# Patient Record
Sex: Female | Born: 1995 | Race: White | Hispanic: No | Marital: Single | Smoking: Never smoker
Health system: Southern US, Community
[De-identification: ages and names within clinical notes are randomized; demographics above are authoritative.]

## PROBLEM LIST (undated history)

## (undated) DIAGNOSIS — F32A Depression, unspecified: Secondary | ICD-10-CM

## (undated) DIAGNOSIS — F419 Anxiety disorder, unspecified: Secondary | ICD-10-CM

---
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
## 2016-01-10 ENCOUNTER — Encounter: Payer: Self-pay | Admitting: *Deleted

## 2016-01-10 ENCOUNTER — Emergency Department
Admission: EM | Admit: 2016-01-10 | Discharge: 2016-01-10 | Disposition: A | Discharge status: Home / Self Care | Payer: 59 | Attending: Emergency Medicine | Admitting: Emergency Medicine

## 2016-01-10 DIAGNOSIS — N3001 Acute cystitis with hematuria: Secondary | ICD-10-CM | POA: Insufficient documentation

## 2016-01-10 DIAGNOSIS — R319 Hematuria, unspecified: Secondary | ICD-10-CM | POA: Diagnosis present

## 2016-01-10 LAB — URINALYSIS COMPLETE WITH MICROSCOPIC (ARMC ONLY)
Bacteria, UA: NONE SEEN
SPECIFIC GRAVITY, URINE: 1.015 (ref 1.005–1.030)
SQUAMOUS EPITHELIAL / LPF: NONE SEEN

## 2016-01-10 LAB — POCT PREGNANCY, URINE: Preg Test, Ur: NEGATIVE

## 2016-01-10 MED ORDER — PHENAZOPYRIDINE HCL 200 MG PO TABS
200.0000 mg | ORAL_TABLET | Freq: Once | ORAL | Status: AC
  Administered 2016-01-10: 200 mg via ORAL
  Filled 2016-01-10: qty 1

## 2016-01-10 MED ORDER — TRAMADOL HCL 50 MG PO TABS: 50.0000 mg | ORAL_TABLET | Freq: Three times a day (TID) | ORAL | 0 refills | Status: DC | PRN

## 2016-01-10 MED ORDER — TRAMADOL HCL 50 MG PO TABS
50.0000 mg | ORAL_TABLET | Freq: Once | ORAL | Status: AC
  Administered 2016-01-10: 50 mg via ORAL
  Filled 2016-01-10: qty 1

## 2016-01-10 MED ORDER — ONDANSETRON 4 MG PO TBDP
4.0000 mg | ORAL_TABLET | Freq: Once | ORAL | Status: AC
  Administered 2016-01-10: 4 mg via ORAL
  Filled 2016-01-10: qty 1

## 2016-01-10 MED ORDER — ONDANSETRON 4 MG PO TBDP: 4.0000 mg | ORAL_TABLET | Freq: Four times a day (QID) | ORAL | 0 refills | Status: DC | PRN

## 2016-01-10 MED ORDER — FLAVOXATE HCL 100 MG PO TABS: 100.0000 mg | ORAL_TABLET | Freq: Three times a day (TID) | ORAL | 0 refills | Status: DC | PRN

## 2016-01-10 MED ORDER — SULFAMETHOXAZOLE-TRIMETHOPRIM 800-160 MG PO TABS: 1.0000 | ORAL_TABLET | Freq: Two times a day (BID) | ORAL | 0 refills | Status: DC

## 2016-01-10 MED ORDER — SULFAMETHOXAZOLE-TRIMETHOPRIM 800-160 MG PO TABS
1.0000 | ORAL_TABLET | Freq: Once | ORAL | Status: AC
  Administered 2016-01-10: 1 via ORAL
  Filled 2016-01-10: qty 1

## 2016-01-10 NOTE — Discharge Instructions (Signed)
Take the prescription antibiotic as directed. Take the other medicines as needed. Increase fluid intake, but avoid high-sugar and carbonated drinks. Empty your bladder regularly. Follow-up with your provider for test of cure following treatment. Return to the ED for increased pain, vomiting, or abdominal/flank pain.

## 2016-01-10 NOTE — ED Triage Notes (Addendum)
States difficulty urinating that began this AM, states lower back pain and blood in her urine, states hx of same with no diagnosis, pt awake and alert in no acute distress, states she took AZO this AM

## 2016-01-10 NOTE — ED Provider Notes (Signed)
[facility] Emergency Department Provider Note ____________________________________________  Time seen: 1219  I have reviewed the triage vital signs and the nursing notes.  HISTORY  Chief Complaint  Hematuria  HPI Crystal Griffin is a 20 y.o. female isn't to the ED with reports of dysuria and hematuria over the last 3 days. She describes today the onset of gross hematuria after urinating. Over the last 3 days she's had frequency, urgency, and burning pain with discomfort localized to the lower pelvic region and the right flank.She reports fevers yesterday as well as nausea and vomiting. She's had decreased appetite over the last 2-3 days as well. She has dosed Azo this morning for symptom relief. She denies previous history of UTI or any other symptoms at this time.  History reviewed. No pertinent past medical history.  There are no active problems to display for this patient.  No past surgical history on file.  Prior to Admission medications   Medication Sig Start Date End Date Taking? Authorizing Provider  flavoxATE (URISPAS) 100 MG tablet Take 1 tablet (100 mg total) by mouth 3 (three) times daily as needed for bladder spasms. 01/10/16   Flower Franko V Bacon Khalie Wince, PA-C  ondansetron (ZOFRAN ODT) 4 MG disintegrating tablet Take 1 tablet (4 mg total) by mouth every 6 (six) hours as needed for nausea or vomiting. 01/10/16   Mordecai Tindol V Bacon Suresh Audi, PA-C  sulfamethoxazole-trimethoprim (BACTRIM DS,SEPTRA DS) 800-160 MG tablet Take 1 tablet by mouth 2 (two) times daily. 01/10/16   Varick Keys V Bacon Churchill Grimsley, PA-C  traMADol (ULTRAM) 50 MG tablet Take 1 tablet (50 mg total) by mouth 3 (three) times daily as needed. 01/10/16   Johathan Province V Bacon Abdulaziz Toman, PA-C   Allergies Penicillins  History reviewed. No pertinent family history.  Social History Social History  Substance Use Topics  . Smoking status: Not on file  . Smokeless tobacco: Not on file  . Alcohol use Not on file    Review of Systems  Constitutional: Positive for fever. Eyes: Negative for visual changes. ENT: Negative for sore throat. Cardiovascular: Negative for chest pain. Respiratory: Negative for shortness of breath. Gastrointestinal: Negative for abdominal pain, and diarrhea. Reports right flank pain, nausea and vomiting. Genitourinary: Positive for dysuria, frequency, and hematuria. Musculoskeletal: Negative for back pain. Skin: Negative for rash. Neurological: Negative for headaches, focal weakness or numbness. ____________________________________________  PHYSICAL EXAM:  VITAL SIGNS: ED Triage Vitals [01/10/16 1000]  Enc Vitals Group     BP 126/76     Pulse Rate 97     Resp 18     Temp 97.5 F (36.4 C)     Temp Source Oral     SpO2 98 %     Weight 110 lb (49.9 kg)     Height 5\' 6"  (1.676 m)     Head Circumference      Peak Flow      Pain Score 10     Pain Loc      Pain Edu?      Excl. in GC?    Constitutional: Alert and oriented. Well appearing and in no distress. Head: Normocephalic and atraumatic. Cardiovascular: Normal rate, regular rhythm. Normal distal pulses. Respiratory: Normal respiratory effort. No wheezes/rales/rhonchi. Gastrointestinal: Soft and nontender. No distention Or rebound, guarding, or rigidity. Right flank pain noted. Musculoskeletal: Nontender with normal range of motion in all extremities.  Neurologic: Normal speech and language. No gross focal neurologic deficits are appreciated. Skin:  Skin is warm, dry and intact. No  rash noted. Psychiatric: Mood and affect are normal. Patient exhibits appropriate insight and judgment. ____________________________________________   LABS (pertinent positives/negatives) Labs Reviewed  URINALYSIS COMPLETEWITH MICROSCOPIC ([facility] ONLY) - Abnormal; Notable for the following:       Result Value   Color, Urine ORANGE (*)    APPearance CLOUDY (*)    Glucose, UA   (*)    Value: TEST NOT REPORTED DUE TO COLOR  INTERFERENCE OF URINE PIGMENT   Bilirubin Urine   (*)    Value: TEST NOT REPORTED DUE TO COLOR INTERFERENCE OF URINE PIGMENT   Ketones, ur   (*)    Value: TEST NOT REPORTED DUE TO COLOR INTERFERENCE OF URINE PIGMENT   Hgb urine dipstick   (*)    Value: TEST NOT REPORTED DUE TO COLOR INTERFERENCE OF URINE PIGMENT   Protein, ur   (*)    Value: TEST NOT REPORTED DUE TO COLOR INTERFERENCE OF URINE PIGMENT   Nitrite   (*)    Value: TEST NOT REPORTED DUE TO COLOR INTERFERENCE OF URINE PIGMENT   Leukocytes, UA   (*)    Value: TEST NOT REPORTED DUE TO COLOR INTERFERENCE OF URINE PIGMENT   All other components within normal limits  URINE CULTURE  POC URINE PREG, ED  POCT PREGNANCY, URINE  ____________________________________________  PROCEDURES  Zofran 4 mg ODT Bactrim DS 1 PO Pyridium 200 mg PO Ultram 50 mg PO ____________________________________________  INITIAL IMPRESSION / ASSESSMENT AND PLAN / ED COURSE  Patient with an acute cystitis with hematuria on presentation. She is afebrile on presentation otherwise with a benign exam. She'll be discharged prescriptions for Bactrim, Pyridium, Zofran, and Ultram. She is encouraged to increase fluid intake at the lateral scheduled. Urine culture is pending at the time of discharge. She'll follow-up with primary pediatric physician or return to the ED for acutely worsening symptoms as discussed.  ____________________________________________  FINAL CLINICAL IMPRESSION(S) / ED DIAGNOSES  Final diagnoses:  Acute cystitis with hematuria      Lissa HoardJenise V Bacon Shaneca Orne, PA-C 01/10/16 1405    Emily FilbertJonathan E Williams, MD 01/10/16 [id number]

## 2016-01-12 LAB — URINE CULTURE
Culture: >=100000 — AB
SPECIAL REQUESTS: NORMAL

## 2016-04-02 IMAGING — CT CT STONE STUDY
1 of 4 series · 5 of 46 positions shown, 10 images · non-contrast
Comparison: None.

CLINICAL DATA: Flank pain.  Gross hematuria.

EXAM:
CT ABDOMEN AND PELVIS WITHOUT CONTRAST
TECHNIQUE: Multidetector CT imaging of the abdomen and pelvis was performed
following the standard protocol without IV contrast.

[Series 4: lung windows · axial · 0.66mm/px · z∈[-768,-688]mm · 5 of 25 slices shown, 10 images]
[im 5/25  soft-tissue]
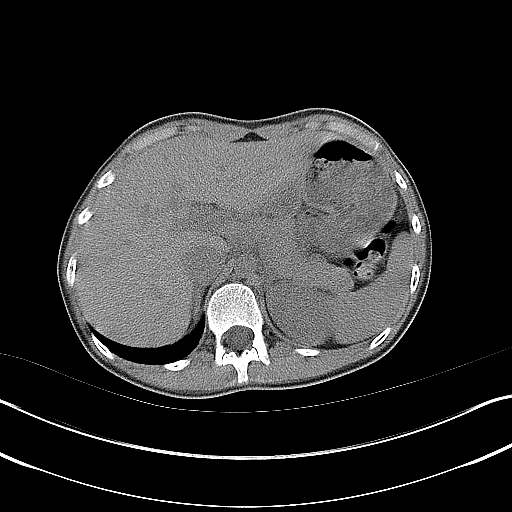
[im 5/25  bone]
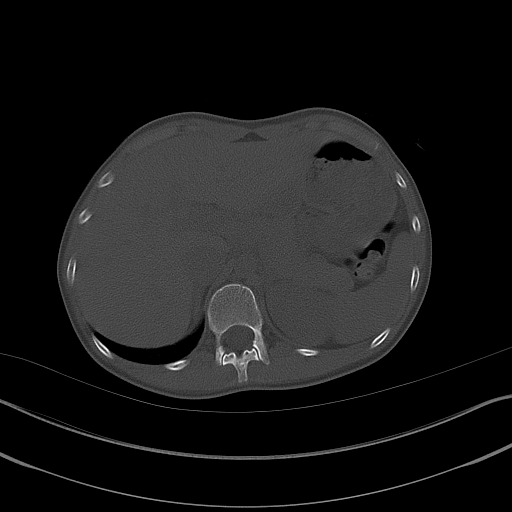
[im 9/25  soft-tissue]
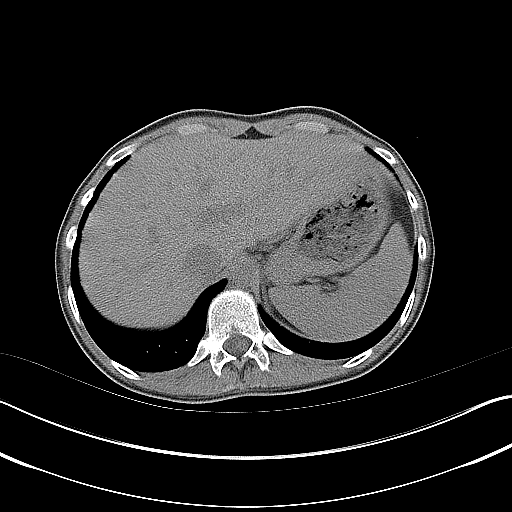
[im 9/25  lung]
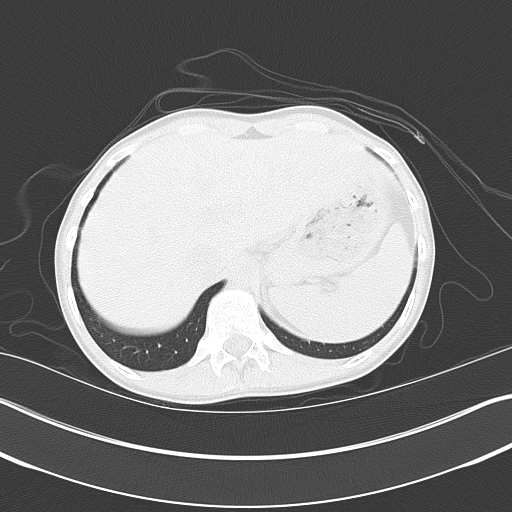
[im 13/25  soft-tissue]
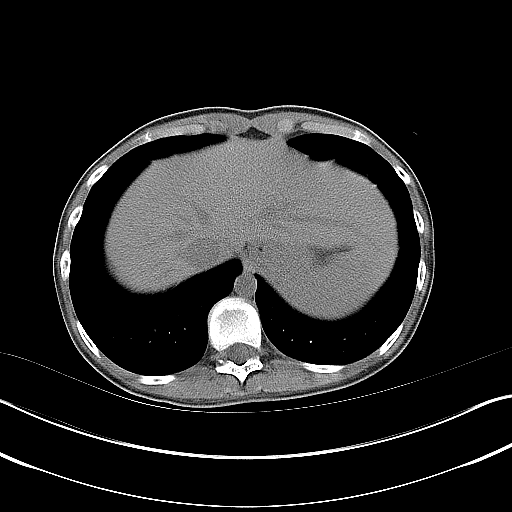
[im 13/25  lung]
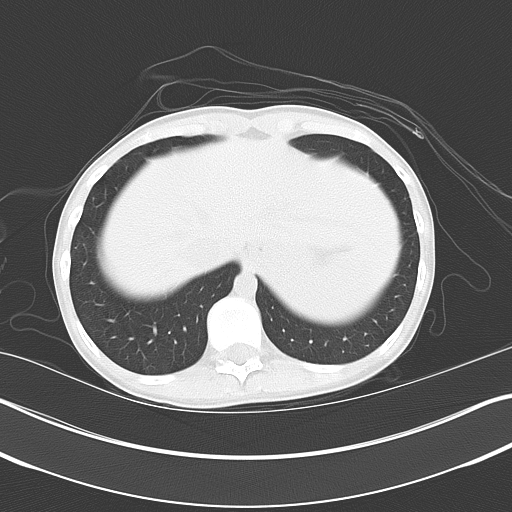
[im 17/25  soft-tissue]
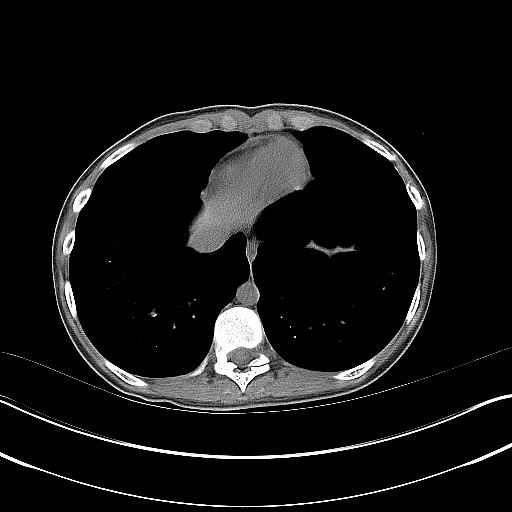
[im 17/25  lung]
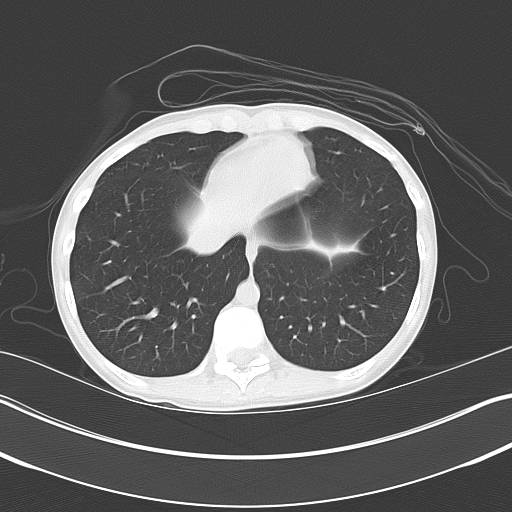
[im 21/25  soft-tissue]
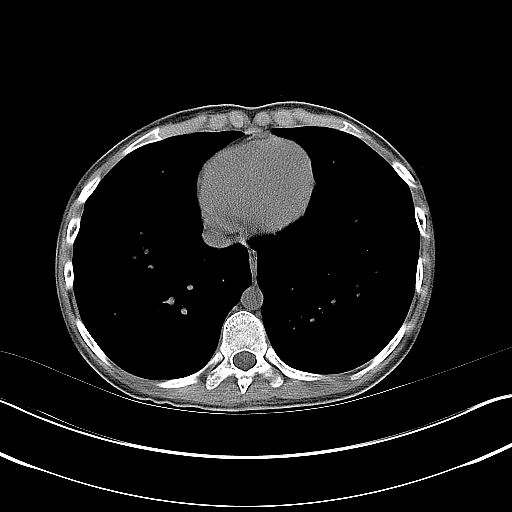
[im 21/25  lung]
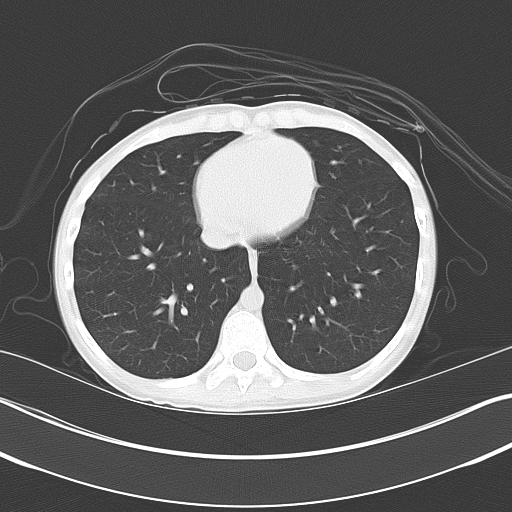

[5 of 46 positions shown; findings below may reference images not displayed]

FINDINGS: The lung bases are clear.

The unenhanced appearance of the liver and spleen are unremarkable.
No focal lesions and no biliary dilatation. The pancreas is grossly
normal. The adrenal glands and kidneys are unremarkable. No renal or
obstructing ureteral calculi or bladder calculi.

The stomach, duodenum, small bowel and colon are grossly normal. No
inflammatory changes, mass lesions or obstructive findings. No
mesenteric or retroperitoneal mass or adenopathy. The aorta is
normal in caliber.

The uterus and ovaries are unremarkable. The bladder is normal. No
pelvic mass, adenopathy or free pelvic fluid collections. No
inguinal mass or adenopathy.

The bony structures are unremarkable.
IMPRESSION: No acute abdominal/ pelvic findings.

No renal, ureteral or bladder calculi.

## 2023-01-06 ENCOUNTER — Ambulatory Visit
Admission: RE | Admit: 2023-01-06 | Discharge: 2023-01-06 | Disposition: A | Discharge status: Home / Self Care | Payer: Managed Care, Other (non HMO) | Source: Ambulatory Visit

## 2023-01-06 VITALS — BP 113/75 | HR 73 | Temp 98.4°F | Resp 16 | Ht 67.0 in | Wt 150.0 lb

## 2023-01-06 DIAGNOSIS — H6123 Impacted cerumen, bilateral: Secondary | ICD-10-CM

## 2023-01-06 DIAGNOSIS — J069 Acute upper respiratory infection, unspecified: Secondary | ICD-10-CM

## 2023-01-06 DIAGNOSIS — R059 Cough, unspecified: Secondary | ICD-10-CM | POA: Diagnosis not present

## 2023-01-06 MED ORDER — PROMETHAZINE-DM 6.25-15 MG/5ML PO SYRP: 5.0000 mL | ORAL_SOLUTION | Freq: Two times a day (BID) | ORAL | 0 refills | Status: DC | PRN

## 2023-01-06 MED ORDER — PREDNISONE 20 MG PO TABS: ORAL_TABLET | ORAL | 0 refills | Status: DC

## 2023-01-06 MED ORDER — BENZONATATE 200 MG PO CAPS: 200.0000 mg | ORAL_CAPSULE | Freq: Three times a day (TID) | ORAL | 0 refills | Status: AC | PRN

## 2023-01-06 MED ORDER — CEFDINIR 300 MG PO CAPS: 300.0000 mg | ORAL_CAPSULE | Freq: Two times a day (BID) | ORAL | 0 refills | Status: AC

## 2023-01-06 NOTE — ED Provider Notes (Signed)
[facility]    CSN: [id number] Arrival date & time: 01/06/23  1058      History   Chief Complaint Chief Complaint  Patient presents with   Cough    I also think I have an ear infection - Entered by patient    HPI Crystal Griffin is a 27 y.o. female.   HPI pleasant 27 year old female presents with cough and bilateral ear pains for 2 weeks.  Patient was evaluated another urgent care and provided antibiotics without any improvement.  Per epic chart review patient was prescribed Ceftin 500 mg tablet twice daily for 10 days for nonrecurrent acute suppurative otitis media of right ear without spontaneous rupture of TM.  Patient reports taking OTC Goody powder and pseudoephedrine for her symptoms.  History reviewed. No pertinent past medical history.  There are no problems to display for this patient.   History reviewed. No pertinent surgical history.  OB History   No obstetric history on file.      Home Medications    Prior to Admission medications   Medication Sig Start Date End Date Taking? Authorizing Provider  benzonatate (TESSALON) 200 MG capsule Take 1 capsule (200 mg total) by mouth 3 (three) times daily as needed for up to 7 days. 01/06/23 01/13/23 Yes Trevor Iha, FNP  cefdinir (OMNICEF) 300 MG capsule Take 1 capsule (300 mg total) by mouth 2 (two) times daily for 7 days. 01/06/23 01/13/23 Yes Trevor Iha, FNP  gabapentin (NEURONTIN) 300 MG capsule Take 300 mg by mouth 3 (three) times daily. 10/15/22  Yes [provider]  predniSONE (DELTASONE) 20 MG tablet Take 3 tabs PO daily x 5 days. 01/06/23  Yes Trevor Iha, FNP  promethazine-dextromethorphan (PROMETHAZINE-DM) 6.25-15 MG/5ML syrup Take 5 mLs by mouth 2 (two) times daily as needed for cough. 01/06/23  Yes Trevor Iha, FNP    Family History History reviewed. No pertinent family history.  Social History Social History   Tobacco Use   Smoking status: Never   Smokeless tobacco:  Never  Vaping Use   Vaping status: Every Day  Substance Use Topics   Alcohol use: Yes   Drug use: Never     Allergies   Penicillins   Review of Systems Review of Systems  HENT:  Positive for ear pain.   Respiratory:  Positive for cough.      Physical Exam Triage Vital Signs ED Triage Vitals  Encounter Vitals Group     BP 01/06/23 1144 113/75     Systolic BP Percentile --      Diastolic BP Percentile --      Pulse Rate 01/06/23 1144 73     Resp 01/06/23 1144 16     Temp 01/06/23 1144 98.4 F (36.9 C)     Temp Source 01/06/23 1144 Oral     SpO2 01/06/23 1144 97 %     Weight 01/06/23 1145 150 lb (68 kg)     Height 01/06/23 1145 5\' 7"  (1.702 m)     Head Circumference --      Peak Flow --      Pain Score 01/06/23 1145 7     Pain Loc --      Pain Education --      Exclude from Growth Chart --    No data found.  Updated Vital Signs BP 113/75 (BP Location: Right Arm)   Pulse 73   Temp 98.4 F (36.9 C) (Oral)   Resp 16   Ht 5\' 7"  (  1.702 m)   Wt 150 lb (68 kg)   LMP 01/05/2023 (Exact Date)   SpO2 97%   BMI 23.49 kg/m    Physical Exam Vitals and nursing note reviewed.  Constitutional:      Appearance: Normal appearance. She is normal weight.  HENT:     Head: Normocephalic and atraumatic.     Right Ear: External ear normal.     Left Ear: External ear normal.     Ears:     Comments: Bilateral EACs occluded with excessive cerumen unable to visualize either TM.  Post bilateral ear lavage: Left EAC clear, Left TM-clear, retracted with good light reflex and mobility; Right EAC-clear; Right TM-clear, retracted with good light reflex and mobility    Mouth/Throat:     Mouth: Mucous membranes are moist.     Pharynx: Oropharynx is clear.  Eyes:     Extraocular Movements: Extraocular movements intact.     Conjunctiva/sclera: Conjunctivae normal.     Pupils: Pupils are equal, round, and reactive to light.  Cardiovascular:     Rate and Rhythm: Normal rate and  regular rhythm.     Pulses: Normal pulses.     Heart sounds: Normal heart sounds.  Pulmonary:     Effort: Pulmonary effort is normal.     Breath sounds: Normal breath sounds. No wheezing, rhonchi or rales.     Comments: Frequent nonproductive cough noted on exam Musculoskeletal:        General: Normal range of motion.     Cervical back: Normal range of motion and neck supple.  Skin:    General: Skin is warm and dry.  Neurological:     General: No focal deficit present.     Mental Status: She is alert and oriented to person, place, and time. Mental status is at baseline.  Psychiatric:        Mood and Affect: Mood normal.        Behavior: Behavior normal.      UC Treatments / Results  Labs (all labs ordered are listed, but only abnormal results are displayed) Labs Reviewed - No data to display  EKG   Radiology No results found.  Procedures Procedures (including critical care time)  Medications Ordered in UC Medications - No data to display  Initial Impression / Assessment and Plan / UC Course  I have reviewed the triage vital signs and the nursing notes.  Pertinent labs & imaging results that were available during my care of the patient were reviewed by me and considered in my medical decision making (see chart for details).     MDM: 1.  Acute URI-Rx'd cefdinir 300 mg capsule: Take 1 capsule twice daily x 7 days; 2.  Cough, unspecified type-Rx'd Tessalon 200 mg capsules: Take 1 capsule 3 times daily, as needed for cough, Rx'd Promethazine DM 6.25-15 mg/5 mL syrup: Take 5 mL twice daily, as needed for cough.  3.  Bilateral impacted cerumen-resolved with bilateral ear lavage advised patient bilateral ears are free and clear of cerumen.  Advised please take medications as directed with food to completion.  Advised patient to take prednisone with first dose of cefdinir for the next 5 of 7 days.  Advised may take Tessalon capsules daily or as needed for cough.  Advised may use  Promethazine DM at night prior to sleep for cough due to sedate of effects.  Encouraged to increase daily water intake to 64 ounces per day while taking these medications.  Advised if symptoms  worsen and/or unresolved please follow-up with ENT, PCP or here for further evaluation.  Patient discharged home, hemodynamically stable.  Work note provided to patient prior to discharge per request Final Clinical Impressions(s) / UC Diagnoses   Final diagnoses:  Cough, unspecified type  Bilateral impacted cerumen  Acute URI     Discharge Instructions      Advised patient bilateral ears are free and clear of cerumen.  Advised please take medications as directed with food to completion.  Advised patient to take prednisone with first dose of cefdinir for the next 5 of 7 days.  Advised may take Tessalon capsules daily or as needed for cough.  Advised may use Promethazine DM at night prior to sleep for cough due to sedate of effects.  Encouraged to increase daily water intake to 64 ounces per day while taking these medications.  Advised if symptoms worsen and/or unresolved please follow-up with ENT, PCP or here for further evaluation.     ED Prescriptions     Medication Sig Dispense Auth. Provider   cefdinir (OMNICEF) 300 MG capsule Take 1 capsule (300 mg total) by mouth 2 (two) times daily for 7 days. 14 capsule Trevor Iha, FNP   predniSONE (DELTASONE) 20 MG tablet Take 3 tabs PO daily x 5 days. 15 tablet Trevor Iha, FNP   benzonatate (TESSALON) 200 MG capsule Take 1 capsule (200 mg total) by mouth 3 (three) times daily as needed for up to 7 days. 40 capsule Trevor Iha, FNP   promethazine-dextromethorphan (PROMETHAZINE-DM) 6.25-15 MG/5ML syrup Take 5 mLs by mouth 2 (two) times daily as needed for cough. 118 mL Trevor Iha, FNP      PDMP not reviewed this encounter.   Trevor Iha, FNP 01/06/23 1334

## 2023-01-06 NOTE — ED Triage Notes (Signed)
Patient c/o cough and bilateral ear pain x 2 weeks.  Patient was seen at another urgent care and given an antibiotic w/o any improvement.  Patient has been taken [employer] and Sudafed.

## 2023-01-06 NOTE — Discharge Instructions (Addendum)
Advised patient bilateral ears are free and clear of cerumen.  Advised please take medications as directed with food to completion.  Advised patient to take prednisone with first dose of cefdinir for the next 5 of 7 days.  Advised may take Tessalon capsules daily or as needed for cough.  Advised may use Promethazine DM at night prior to sleep for cough due to sedate of effects.  Encouraged to increase daily water intake to 64 ounces per day while taking these medications.  Advised if symptoms worsen and/or unresolved please follow-up with ENT, PCP or here for further evaluation.

## 2023-02-17 ENCOUNTER — Ambulatory Visit
Admission: RE | Admit: 2023-02-17 | Discharge: 2023-02-17 | Disposition: A | Discharge status: Home / Self Care | Payer: Managed Care, Other (non HMO) | Source: Ambulatory Visit | Attending: Internal Medicine | Admitting: Internal Medicine

## 2023-02-17 VITALS — BP 127/88 | HR 104 | Temp 98.4°F | Resp 17

## 2023-02-17 DIAGNOSIS — R509 Fever, unspecified: Secondary | ICD-10-CM

## 2023-02-17 DIAGNOSIS — J101 Influenza due to other identified influenza virus with other respiratory manifestations: Secondary | ICD-10-CM

## 2023-02-17 HISTORY — DX: Depression, unspecified: F32.A

## 2023-02-17 LAB — POC COVID19/FLU A&B COMBO
Covid Antigen, POC: NEGATIVE
Influenza A Antigen, POC: NEGATIVE
Influenza B Antigen, POC: POSITIVE — AB

## 2023-02-17 MED ORDER — FLUTICASONE PROPIONATE 50 MCG/ACT NA SUSP: 1.0000 | Freq: Every day | NASAL | 0 refills | Status: AC | PRN

## 2023-02-17 MED ORDER — OSELTAMIVIR PHOSPHATE 75 MG PO CAPS: 75.0000 mg | ORAL_CAPSULE | Freq: Two times a day (BID) | ORAL | 0 refills | Status: AC

## 2023-02-17 MED ORDER — PROMETHAZINE-DM 6.25-15 MG/5ML PO SYRP: 5.0000 mL | ORAL_SOLUTION | Freq: Four times a day (QID) | ORAL | 0 refills | Status: AC | PRN

## 2023-02-17 NOTE — ED Triage Notes (Signed)
 Pt had cough and congestion for 3 days. Had some fevers, decreased appetite. Taken Mucinex and Goody's powder. Ears are having lots of pressure and muffled sound.

## 2023-02-17 NOTE — ED Provider Notes (Signed)
 [facility]  Note:  This document was prepared using Dragon voice recognition software and may include unintentional dictation errors.  MRN: [id number] DOB: [birth year] DATE: 02/17/23   Subjective:  Chief Complaint:  Chief Complaint  Patient presents with   Nasal Congestion   Fever     HPI: Crystal Griffin is a 28 y.o. female presenting for fever and nasal congestion for 3 days. Patient states she started with nasal congestion and runny nose 3 days ago. She states the congestion has been getting worse and she started with a fever yesterday. Max temperature of 101 yesterday. Reports decreased appetite and dry cough as well. She states that her ears feel clogged with pressure as well. No known sick contacts. Denies nausea/vomiting, abdominal pain, sore throat, otalgia. Endorses fever, cough, congestion. Presents NAD.  Prior to Admission medications   Medication Sig Start Date End Date Taking? Authorizing Provider  gabapentin (NEURONTIN) 300 MG capsule Take 300 mg by mouth 3 (three) times daily. 10/15/22   [provider]     Allergies  Allergen Reactions   Penicillins     History:   Past Medical History:  Diagnosis Date   Depression      Past Surgical History:  Procedure Laterality Date   CESAREAN SECTION      History reviewed. No pertinent family history.  Social History   Tobacco Use   Smoking status: Never   Smokeless tobacco: Never  Vaping Use   Vaping status: Every Day  Substance Use Topics   Alcohol use: Yes   Drug use: Never    Review of Systems  Constitutional:  Positive for appetite change, fatigue and fever.  HENT:  Positive for congestion, hearing loss, rhinorrhea and sinus pressure. Negative for ear pain and sore throat.   Respiratory:  Positive for cough.   Gastrointestinal:  Negative for abdominal pain, nausea and vomiting.     Objective:   Vitals: BP 127/88 (BP Location: Right Arm)   Pulse (!) 104   Temp 98.4 F (36.9 C)  (Oral)   Resp 17   LMP 02/10/2023 (Approximate)   SpO2 96%   Physical Exam Constitutional:      General: She is not in acute distress.    Appearance: Normal appearance. She is well-developed and overweight. She is not ill-appearing or toxic-appearing.  HENT:     Head: Normocephalic and atraumatic.     Right Ear: Ear canal normal. A middle ear effusion is present.     Left Ear: Ear canal normal. A middle ear effusion is present.     Nose: Rhinorrhea present. Rhinorrhea is clear.     Mouth/Throat:     Pharynx: Oropharynx is clear. Uvula midline. No pharyngeal swelling, oropharyngeal exudate or posterior oropharyngeal erythema.     Tonsils: No tonsillar exudate or tonsillar abscesses.  Cardiovascular:     Rate and Rhythm: Normal rate and regular rhythm.     Heart sounds: Normal heart sounds.  Pulmonary:     Effort: Pulmonary effort is normal.     Breath sounds: Normal breath sounds.     Comments: Clear to auscultation bilaterally  Abdominal:     General: Bowel sounds are normal.     Palpations: Abdomen is soft.     Tenderness: There is no abdominal tenderness.  Skin:    General: Skin is warm and dry.  Neurological:     General: No focal deficit present.     Mental Status: She is alert.  Psychiatric:  Mood and Affect: Mood and affect normal.     Results:  Labs: Results for orders placed or performed during the hospital encounter of 02/17/23 (from the past 24 hours)  POC Covid19/Flu A&B Antigen     Status: Abnormal   Collection Time: 02/17/23 11:06 AM  Result Value Ref Range   Influenza A Antigen, POC Negative Negative   Influenza B Antigen, POC Positive (A) Negative   Covid Antigen, POC Negative Negative    Radiology: No results found.   UC Course/Treatments:  Procedures: Procedures   Medications Ordered in UC: Medications - No data to display   Assessment and Plan :     ICD-10-CM   1. Influenza B  J10.1     2. Fever, unspecified  R50.9       Influenza B Afebrile, nontoxic-appearing, NAD. VSS. DDX includes but not limited to: COVID, flu, bronchitis, pneumonia, viral URI Flu was positive today in office.  Tamiflu  75mg  BID was prescribed as well as Promethazine -DM QID PRN was prescribed for cough. Flonase  1 spray each nostril every day PRN was prescribed for congestion and post nasal drip. Strict ED precautions were given and patient verbalized understanding.  Fever, unspecified Afebrile, nontoxic-appearing, NAD. VSS. DDX includes but not limited to: Afebrile, nontoxic-appearing, NAD. VSS. DDX includes but not limited to: COVID, flu, bronchitis, pneumonia, viral URI, sinusitis, cystitis, septic  Flu was positive today in office. Recommend OTC analgesics as needed for fevers. Strict ED precautions were given and patient verbalized understanding.  ED Discharge Orders          Ordered    oseltamivir  (TAMIFLU ) 75 MG capsule  Every 12 hours        02/17/23 1107    promethazine -dextromethorphan (PROMETHAZINE -DM) 6.25-15 MG/5ML syrup  4 times daily PRN        02/17/23 1110    fluticasone  (FLONASE ) 50 MCG/ACT nasal spray  Daily PRN        02/17/23 1110             I have reviewed the PDMP during this encounter.      Basilia Ulanda SQUIBB, PA-C 02/17/23 1112

## 2023-02-17 NOTE — Discharge Instructions (Addendum)
 You are positive for the flu. Influenza is a virus. A prescription (Tamiflu ) was sent to your pharmacy to help with the duration of symptoms. Recommend you rest and increase oral fluids. Tylenol/Ibuprofen as directed for fevers and aches. I have also sent a prescription for a cough medicine for you to have as well. It is important for you to pay attention to any new symptoms or worsening of your current condition. Please go directly to the Emergency Department immediately should you have any of the following symptoms: chest pain, shortness of breath or difficulty breathing.

## 2023-04-27 ENCOUNTER — Ambulatory Visit
Admission: EM | Admit: 2023-04-27 | Discharge: 2023-04-27 | Disposition: A | Discharge status: Home / Self Care | Attending: Physician Assistant | Admitting: Physician Assistant

## 2023-04-27 DIAGNOSIS — R112 Nausea with vomiting, unspecified: Secondary | ICD-10-CM

## 2023-04-27 DIAGNOSIS — A084 Viral intestinal infection, unspecified: Secondary | ICD-10-CM

## 2023-04-27 HISTORY — DX: Anxiety disorder, unspecified: F41.9

## 2023-04-27 LAB — POC COVID19/FLU A&B COMBO
Covid Antigen, POC: NEGATIVE
Influenza A Antigen, POC: NEGATIVE
Influenza B Antigen, POC: NEGATIVE

## 2023-04-27 MED ORDER — ONDANSETRON 4 MG PO TBDP
4.0000 mg | ORAL_TABLET | Freq: Once | ORAL | Status: AC
  Administered 2023-04-27: 4 mg via ORAL

## 2023-04-27 MED ORDER — ONDANSETRON 4 MG PO TBDP: 4.0000 mg | ORAL_TABLET | Freq: Three times a day (TID) | ORAL | 0 refills | Status: AC | PRN

## 2023-04-27 NOTE — Discharge Instructions (Addendum)
 Your COVID and Flu testing were negative today.  We administered a medication called Zofran 4 mg disintegrating tablet to assist with your nausea and hopefully prevent further vomiting.  At this time I suspect you have a viral GI bug or "viral gastroenteritis" which is causing your symptoms.  Management for this is typically symptomatic and reducing the risk of dehydration from fluid loss. I usually recommend taking an antiemetic such as Zofran.  This has been sent to the pharmacy that we have on file.  You can take this up to every 8 hours as needed for nausea and vomiting.  This should allow you to drink plenty of fluids.  Please be advised that Zofran can cause constipation.  If this happens you can use a stool softener or a few doses of MiraLAX per manufacturer's instructions to assist with having a bowel movement.  I typically recommend at least 1 electrolyte drink per day.  You can consume things such as sugar-free Gatorade, Pedialyte, liquid IV per your preference.  Please be advised that excess sugar in these beverages can make GI irritation worse along with diarrhea.  If you feel like you are able to you can consume small portions of a bland diet.  Once you are feeling better you can slowly start to transition to your normal diet as tolerated.  If your symptoms start to recur I recommend going back to a bland diet and then trying again in a few days to return to regular intake. As needed you can use Pepto-Bismol or Tums to assist with acid reflux.  This can sometimes be aggravated by persistent nausea and vomiting. I recommend taking Tylenol as needed for body aches and fever reduction.  I would try to avoid NSAIDs until you are able to tolerate foods as these medications can be hard on the stomach when it is empty and can even cause GI bleeds when they are not consumed with food. Please make sure that you are washing your hands and cleaning surfaces that you have come into contact with.   Handwashing is often the most effective way to prevent further transmission to others. If at any point you start to develop fevers that are not responding to medications, intense abdominal pain, vomiting and diarrhea that are preventing you from adequately intaking fluids, signs of dehydration, feeling faint or passing out please go to the emergency room as these could be signs of a medical emergency.

## 2023-04-27 NOTE — ED Provider Notes (Addendum)
 [facility]    CSN: [id number] Arrival date & time: 04/27/23  1134      History   Chief Complaint Chief Complaint  Patient presents with   Emesis   Generalized Body Aches    HPI [facility] is a 28 y.o. female.   HPI   She reports her symptoms started Friday evening She states last weekend her daughter was having similar symptoms and several of her family members developed the same symptoms after exposure to her daughter She reports persistent nausea and vomiting. She states she has also had some diarrhea and sore throat but she is not sure if this is from the vomiting She states she has been able to drink more than eat. She was able to eat some toast this AM and has kept it down     Past Medical History:  Diagnosis Date   Anxiety    Depression     There are no active problems to display for this patient.   Past Surgical History:  Procedure Laterality Date   CESAREAN SECTION      OB History     Gravida  5   Para  3   Term  1   Preterm      AB  2   Living  1      SAB  2   IAB      Ectopic      Multiple      Live Births  1            Home Medications    Prior to Admission medications   Medication Sig Start Date End Date Taking? Authorizing Provider  LORazepam (ATIVAN) 0.5 MG tablet Take 0.25-0.5 mg by mouth every 6 (six) hours as needed. 04/18/23  Yes [provider]  ondansetron (ZOFRAN-ODT) 4 MG disintegrating tablet Take 1 tablet (4 mg total) by mouth every 8 (eight) hours as needed for nausea or vomiting. 04/27/23  Yes Leiani Enright E, PA-C  fluticasone (FLONASE) 50 MCG/ACT nasal spray Place 1 spray into both nostrils daily as needed. 02/17/23   Hermanns, Ashlee P, PA-C  gabapentin (NEURONTIN) 300 MG capsule Take 300 mg by mouth 3 (three) times daily. 10/15/22  Yes [provider]  promethazine-dextromethorphan (PROMETHAZINE-DM) 6.25-15 MG/5ML syrup Take 5 mLs by mouth 4 (four) times daily as needed for cough.  02/17/23   Hermanns, Ashlee P, PA-C    Family History Family History  Problem Relation Age of Onset   Healthy Mother    Healthy Father    Healthy Brother     Social History Social History   Tobacco Use   Smoking status: Never   Smokeless tobacco: Never  Vaping Use   Vaping status: Every Day  Substance Use Topics   Alcohol use: Yes    Comment: occ   Drug use: Not Currently    Types: Marijuana     Allergies   Penicillins   Review of Systems Review of Systems  Constitutional:  Positive for chills, diaphoresis and fatigue. Negative for fever.  Respiratory:  Negative for cough and choking.   Gastrointestinal:  Positive for abdominal pain (periumbilical- improving today. Sharp in nature), diarrhea, nausea and vomiting. Negative for blood in stool.  Musculoskeletal:  Positive for myalgias.  Neurological:  Positive for light-headedness.     Physical Exam Triage Vital Signs ED Triage Vitals  Encounter Vitals Group     BP 04/27/23 1151 109/72     Systolic BP Percentile --  Diastolic BP Percentile --      Pulse Rate 04/27/23 1151 87     Resp 04/27/23 1151 18     Temp 04/27/23 1151 97.7 F (36.5 C)     Temp Source 04/27/23 1151 Oral     SpO2 04/27/23 1151 97 %     Weight --      Height --      Head Circumference --      Peak Flow --      Pain Score 04/27/23 1146 8     Pain Loc --      Pain Education --      Exclude from Growth Chart --    No data found.  Updated Vital Signs BP 109/72 (BP Location: Right Arm)   Pulse 87   Temp 97.7 F (36.5 C) (Oral)   Resp 18   LMP 03/28/2023 (Approximate)   SpO2 97%   Visual Acuity Right Eye Distance:   Left Eye Distance:   Bilateral Distance:    Right Eye Near:   Left Eye Near:    Bilateral Near:     Physical Exam Vitals reviewed.  Constitutional:      General: She is awake.     Appearance: Normal appearance. She is well-developed and well-groomed.  HENT:     Head: Normocephalic and atraumatic.      Right Ear: Hearing, tympanic membrane and ear canal normal.     Left Ear: Hearing, tympanic membrane and ear canal normal.     Mouth/Throat:     Lips: Pink.     Mouth: Mucous membranes are moist.     Pharynx: Oropharynx is clear. Uvula midline. Posterior oropharyngeal erythema present. No pharyngeal swelling, oropharyngeal exudate, uvula swelling or postnasal drip.     Tonsils: No tonsillar exudate or tonsillar abscesses.  Eyes:     General: Lids are normal. Gaze aligned appropriately.     Extraocular Movements: Extraocular movements intact.     Conjunctiva/sclera: Conjunctivae normal.  Cardiovascular:     Rate and Rhythm: Normal rate and regular rhythm.     Heart sounds: Normal heart sounds. No murmur heard.    No friction rub. No gallop.  Pulmonary:     Effort: Pulmonary effort is normal.     Breath sounds: Normal breath sounds. No decreased air movement. No decreased breath sounds, wheezing, rhonchi or rales.  Abdominal:     General: Abdomen is flat. Bowel sounds are normal.     Palpations: Abdomen is soft.     Tenderness: There is no abdominal tenderness.  Lymphadenopathy:     Head:     Right side of head: No submental, submandibular or preauricular adenopathy.     Left side of head: No submental, submandibular or preauricular adenopathy.     Cervical:     Right cervical: No superficial cervical adenopathy.    Left cervical: No superficial cervical adenopathy.     Upper Body:     Right upper body: No supraclavicular adenopathy.     Left upper body: No supraclavicular adenopathy.  Neurological:     General: No focal deficit present.     Mental Status: She is alert and oriented to person, place, and time.     GCS: GCS eye subscore is 4. GCS verbal subscore is 5. GCS motor subscore is 6.     Cranial Nerves: No cranial nerve deficit, dysarthria or facial asymmetry.  Psychiatric:        Behavior: Behavior is cooperative.  UC Treatments / Results  Labs (all labs ordered  are listed, but only abnormal results are displayed) Labs Reviewed  POC COVID19/FLU A&B COMBO    EKG   Radiology No results found.  Procedures Procedures (including critical care time)  Medications Ordered in UC Medications  ondansetron (ZOFRAN-ODT) disintegrating tablet 4 mg (4 mg Oral Given 04/27/23 1214)    Initial Impression / Assessment and Plan / UC Course  I have reviewed the triage vital signs and the nursing notes.  Pertinent labs & imaging results that were available during my care of the patient were reviewed by me and considered in my medical decision making (see chart for details).    COVID and flu testing are negative.  Results were discussed with patient during her appointment.  Final Clinical Impressions(s) / UC Diagnoses   Final diagnoses:  Nausea and vomiting, unspecified vomiting type  Viral gastroenteritis   Patient presents today with concerns for persistent nausea, vomiting and mild diarrhea.  She reports some sharp periumbilical abdominal pain as well.  Vitals are overall reassuring today.  Physical exam demonstrates some mild tenderness to the abdomen but overall normal findings.  Given her HPI, recent interaction with others with similar symptoms I am most suspicious for viral gastroenteritis at this time.  Given lack of fever and significant abdominal pain I am less concerned for acute abdomen.  Reviewed that viral gastroenteritis is typically managed with symptomatic control and increased hydration, bland diet until resolution.  Will provide single dose of Zofran 4 mg disintegrating tablet here in clinic.  Will also send in prescription for same.  Reviewed increasing hydration efforts as well as electrolyte replacement.  Bland diet also reviewed to assist with preventing further symptoms.  Recommend using Pepto-Bismol or Tums per preference for potential reflux from vomiting.  Reviewed that she should use Tylenol versus NSAIDs to assist with bodyaches as  NSAIDs during GI upset may cause more severe complications.  ED and return precautions reviewed and provided in after visit summary.  Follow-up as needed for progressing or persistent symptoms    Discharge Instructions      Your COVID and Flu testing were negative today.  We administered a medication called Zofran 4 mg disintegrating tablet to assist with your nausea and hopefully prevent further vomiting.  At this time I suspect you have a viral GI bug or "viral gastroenteritis" which is causing your symptoms.  Management for this is typically symptomatic and reducing the risk of dehydration from fluid loss. I usually recommend taking an antiemetic such as Zofran.  This has been sent to the pharmacy that we have on file.  You can take this up to every 8 hours as needed for nausea and vomiting.  This should allow you to drink plenty of fluids.  Please be advised that Zofran can cause constipation.  If this happens you can use a stool softener or a few doses of MiraLAX per manufacturer's instructions to assist with having a bowel movement.  I typically recommend at least 1 electrolyte drink per day.  You can consume things such as sugar-free Gatorade, Pedialyte, liquid IV per your preference.  Please be advised that excess sugar in these beverages can make GI irritation worse along with diarrhea.  If you feel like you are able to you can consume small portions of a bland diet.  Once you are feeling better you can slowly start to transition to your normal diet as tolerated.  If your symptoms start to recur I  recommend going back to a bland diet and then trying again in a few days to return to regular intake. As needed you can use Pepto-Bismol or Tums to assist with acid reflux.  This can sometimes be aggravated by persistent nausea and vomiting. I recommend taking Tylenol as needed for body aches and fever reduction.  I would try to avoid NSAIDs until you are able to tolerate foods as these medications  can be hard on the stomach when it is empty and can even cause GI bleeds when they are not consumed with food. Please make sure that you are washing your hands and cleaning surfaces that you have come into contact with.  Handwashing is often the most effective way to prevent further transmission to others. If at any point you start to develop fevers that are not responding to medications, intense abdominal pain, vomiting and diarrhea that are preventing you from adequately intaking fluids, signs of dehydration, feeling faint or passing out please go to the emergency room as these could be signs of a medical emergency.     ED Prescriptions     Medication Sig Dispense Auth. Provider   ondansetron (ZOFRAN-ODT) 4 MG disintegrating tablet Take 1 tablet (4 mg total) by mouth every 8 (eight) hours as needed for nausea or vomiting. 20 tablet Joscelyne Renville E, PA-C      PDMP not reviewed this encounter.   Hanny Elsberry, Oswaldo Conroy, PA-C 04/27/23 1229    Brexley Cutshaw, Oswaldo Conroy, PA-C 04/27/23 1230

## 2023-04-27 NOTE — ED Triage Notes (Signed)
 Pt c/o vomiting, diarrhea, fatigue, dizziness, loss of appetite, body aches, and nausea.  Start date: 04/25/2023  Home Interventions: None

## 2023-09-18 NOTE — Progress Notes (Signed)
 Dear No primary care provider on file.,   Thank you for the opportunity to see Eliazar Longs Schubring in neurological consultation.  Below, you will find my History and Physical report.  Please do not hesitate to contact my office if you have any questions or concerns.  Thank you for allowing me to participate in her care with you.    Sincerely, Warren Flicker, FNP  Subjective   Patient ID:  [facility] is a 28 y.o. (DOB September 03, 1995) female    Patient presents with  . Migraine    In the past four weeks she has had 3 bad ones her HA's started about a year ago      HPI (09/19/2023): This is a 28 y.o. female   she presents today for evaluation of headaches. PCP recently prescribed Imitrex 50mg  09/12/23.   See headache history below for current headache details.   Headache History:  Age of Onset: 28 years old    Characteristics: Location: Left , Occipital, and shoulder region Quality: sharp, shooting, stabbing, and throbbing Severity: Moderate and Severe Pain Radiation: Pain radiates from left shoulder and into occipital region and then radiates forward into her left retro-orbital region  Worsens with activity: No   Associated Features: Aura: No none reported or described Photophobia: Yes Phonophobia: No Nausea: Yes Vomiting: Yes Additional: dizziness (room spinning)  Duration: Greater than 4 hours per episode up to 24 hours     Frequency: 16/30 days per month of which 4/severe, 8/moderate, 4/mild Baseline: 1-2/30   Aggravating Factors: emotional stress and work   Alleviating Factors: Baclofen, sleep , and unable to obtain relief with prescription medication(s)  Neuroimaging: no  Mental Health: PHQ9: 5. Denies SI/HI.  Sleep Quality: ESS: 3; reports difficulty initiating or maintaining sleep, sleeps with a toddler and is unable to get comfortable at night.   Water Consumption:  multiple Stanley's per day   Exercise: 6 days per week  Caffeine Constumption: yes - 2  Lattes  per day.   Ophthalmological History: not up-to-date   Menstrual History: irregular .The current method of family planning is none.  Neurological Family History: Headaches: None Brain Aneurysms: Paternal grandparent Brain Tumors: None   Social History Tobacco use: None Alcohol use: 1 glass of wine every few days Illicit drug use: None  Evidence of Medication Overuse Headache: yes - Tylenol and Goody's powder   Current and Previous Medication Trials:  Analgesics: Tylenol Anti-migraine: Imitrex- caused side effects   Decongestant / Antihistamine: Hydroxyzine Anti-Nauseant: Zofran     NSAIDS: Ibuprofen, aspirin Muscle Relaxants: baclofen  Cardiac:   Anti-Convulsants: gabapentin  Anti-Depressants:   Steroids:    Sleeping Pills / Tranquilizers:   Herbal: Melatonin     Anti-CGRP medications:   Procedures for Headache:   Neuromodulation Devices:  Rehab:    No past medical history on file.  Past Surgical History:  Procedure Laterality Date  . Cesarean section    . Urethra surgery      No family history on file.   Medications Ordered Prior to Encounter[1]  Allergies[2]  The patient's allergies, current medications, past family history, past medical history, past social history, past surgical history and problem list were reviewed and updated as appropriate.    Impression   1. Cervicogenic migraine   2. Chronic left shoulder pain   3. Neck pain      Patient has been experiencing headaches for 1 year, which are unchanged in quality and frequency from her historical ones. The patient describes her  symptoms are originating from her left shoulder and neck, so I will have her evaluated by physical therapy. Patient has been prescribed muscle relaxant and anticonvulsant medications for management of her headaches. Patient is over using over-the-counter medications. For headache prevention will have patient start Cymbalta. For acute headaches will have patient take  metaxalone  as needed.   Plan   Orders Placed This Encounter  Procedures  . AMB REFERRAL TO PHYSICAL THERAPY EVALUATION AND TREATMENT    -Maintain headache calendar -Stay well hydrated  Headache Preventive Treatment: -Start duloxetine (Cymbalta) 30 mg daily for migraine/chronic pain. - Continue gabapentin as prescribed by PCP Supplements: Recommend starting Magnesium, CoQ10, Riboflavin   Acute headache medication: D/C baclofen, not effective for the patient. - Avoid triptan class medications, caused side effects.  Patient may qualify for anti-CGRP class medications.  Will reevaluate at follow-up - Start metaxalone (Skelaxin) 800 mg 3 times daily as needed for neck pain/headache.  Follow up in about 3 months (around 12/20/2023).  Risks, benefits, and alternatives of the medications and treatment plan prescribed today were discussed.  The patient expressed understanding of the instructions.  Patient was counseled and provided information on: headache mechanism, medication dosing, and adverse events, analgesic overuse and rebound headache, and as needed the relationship between headaches and sleep, hormones, and mood disorders.  Review of Systems is complete and negative except as noted.  Objective   BP 131/87   Pulse 83   Ht 5' 7 (1.702 m)   Wt 165 lb (74.8 kg)   LMP 08/28/2023   BMI 25.84 kg/m   General:  Alert and oriented, no acute distress HENNT:  Neck is supple.  Eyes anicteric.  Moist oral mucosa. CV:  Regular rate and rhythm.  No obvious murmurs.   Chest:  Lungs are clear to auscultation. Extremities:  No peripheral edema Skin:  No rash  Neurologic: Mental Status:  Alert and oriented.  Language and speech appear grossly intact.  Memory, concentration and fund of knowledge are grossly intact.  Cranial nerve Exam:  Pupils are equal, round and reactive.  Extraocular movements are intact without nystagmus.  Normal facial sensation.  No facial asymmetry.  Gross  auditory is intact.  Tongue is midline and uvula and palate elevates symmetrically.  Motor Exam:  5/5 strength in the upper and lower extremities.  Good muscle tone and bulk.  No pronator drift. Involuntary Movements: Absent.  Deltoid nontender to palpation.   Coordination:  Intact to finger to nose. No dysmetria   Gait:  Normal routine gait.   Reflexes: Trace in the upper and lower extremities.      Sensation:  Intact to light touch, vibration           *This note was dictated with voice recognition software. Inadvertently, similar sounding words can, sometimes, get transcribed incorrectly            [1] Current Outpatient Medications on File Prior to Visit  Medication Sig Dispense Refill  . ergocalciferol (VITAMIN D2) 50,000 units CAPS capsule Take one capsule (50,000 Units dose) by mouth once a week at 0900 for 12 doses. 12 capsule 1  . gabapentin (NEURONTIN) 600 mg tablet Take one tablet (600 mg dose) by mouth 3 (three) times a day. 270 tablet 2  . ondansetron  (ZOFRAN -ODT) 4 mg disintegrating tablet Take one tablet (4 mg dose) by mouth every 8 (eight) hours as needed for up to 7 days. 20 tablet 0   No current facility-administered medications on file prior to  visit.  [2] Allergies Allergen Reactions  . Penicillins Hives, Rash and Swelling

## 2023-10-10 ENCOUNTER — Encounter (HOSPITAL_COMMUNITY): Payer: Self-pay | Admitting: *Deleted

## 2023-10-10 ENCOUNTER — Other Ambulatory Visit: Payer: Self-pay

## 2023-10-10 ENCOUNTER — Emergency Department (HOSPITAL_COMMUNITY)
Admission: EM | Admit: 2023-10-10 | Discharge: 2023-10-11 | Disposition: A | Discharge status: Home / Self Care | Attending: Emergency Medicine | Admitting: Emergency Medicine

## 2023-10-10 DIAGNOSIS — K921 Melena: Secondary | ICD-10-CM | POA: Insufficient documentation

## 2023-10-10 DIAGNOSIS — R112 Nausea with vomiting, unspecified: Secondary | ICD-10-CM | POA: Insufficient documentation

## 2023-10-10 DIAGNOSIS — R10815 Periumbilic abdominal tenderness: Secondary | ICD-10-CM | POA: Diagnosis not present

## 2023-10-10 DIAGNOSIS — R519 Headache, unspecified: Secondary | ICD-10-CM | POA: Insufficient documentation

## 2023-10-10 LAB — URINALYSIS, ROUTINE W REFLEX MICROSCOPIC
Bilirubin Urine: NEGATIVE
Glucose, UA: NEGATIVE mg/dL
Ketones, ur: 80 mg/dL — AB
Nitrite: NEGATIVE
Protein, ur: 30 mg/dL — AB
Specific Gravity, Urine: 1.024 (ref 1.005–1.030)
pH: 6 (ref 5.0–8.0)

## 2023-10-10 LAB — COMPREHENSIVE METABOLIC PANEL WITH GFR
ALT: 45 U/L — ABNORMAL HIGH (ref 0–44)
AST: 36 U/L (ref 15–41)
Albumin: 4.2 g/dL (ref 3.5–5.0)
Alkaline Phosphatase: 81 U/L (ref 38–126)
Anion gap: 13 (ref 5–15)
BUN: 10 mg/dL (ref 6–20)
CO2: 22 mmol/L (ref 22–32)
Calcium: 10.3 mg/dL (ref 8.9–10.3)
Chloride: 103 mmol/L (ref 98–111)
Creatinine, Ser: 0.56 mg/dL (ref 0.44–1.00)
GFR, Estimated: >60 mL/min (ref >60)
Glucose, Bld: 97 mg/dL (ref 70–99)
Potassium: 3.9 mmol/L (ref 3.5–5.1)
Sodium: 138 mmol/L (ref 135–145)
Total Bilirubin: 0.7 mg/dL (ref 0.0–1.2)
Total Protein: 7.5 g/dL (ref 6.5–8.1)

## 2023-10-10 LAB — CBC
HCT: 43.7 % (ref 36.0–46.0)
Hemoglobin: 14.9 g/dL (ref 12.0–15.0)
MCH: 31.4 pg (ref 26.0–34.0)
MCHC: 34.1 g/dL (ref 30.0–36.0)
MCV: 92 fL (ref 80.0–100.0)
Platelets: 341 K/uL (ref 150–400)
RBC: 4.75 MIL/uL (ref 3.87–5.11)
RDW: 12 % (ref 11.5–15.5)
WBC: 8.8 K/uL (ref 4.0–10.5)
nRBC: 0 % (ref 0.0–0.2)

## 2023-10-10 LAB — PREGNANCY, URINE: Preg Test, Ur: NEGATIVE

## 2023-10-10 LAB — LIPASE, BLOOD: Lipase: 30 U/L (ref 11–51)

## 2023-10-10 MED ORDER — KETOROLAC TROMETHAMINE 15 MG/ML IJ SOLN
15.0000 mg | Freq: Once | INTRAMUSCULAR | Status: AC
  Administered 2023-10-10: 15 mg via INTRAVENOUS
  Filled 2023-10-10: qty 1

## 2023-10-10 MED ORDER — LACTATED RINGERS IV BOLUS
1000.0000 mL | Freq: Once | INTRAVENOUS | Status: AC
  Administered 2023-10-10: 1000 mL via INTRAVENOUS

## 2023-10-10 MED ORDER — DIPHENHYDRAMINE HCL 50 MG/ML IJ SOLN
25.0000 mg | Freq: Once | INTRAMUSCULAR | Status: AC
  Administered 2023-10-10: 25 mg via INTRAVENOUS
  Filled 2023-10-10: qty 1

## 2023-10-10 MED ORDER — PROCHLORPERAZINE EDISYLATE 10 MG/2ML IJ SOLN
10.0000 mg | Freq: Once | INTRAMUSCULAR | Status: AC
  Administered 2023-10-10: 10 mg via INTRAVENOUS
  Filled 2023-10-10: qty 2

## 2023-10-10 NOTE — ED Triage Notes (Signed)
 Pt with emesis since this morning.  Pt with blood in stools x 3 days, bright red per pt. Pt tried zofran  without able to keep anything down. Pt with migraine HA as well.

## 2023-10-11 MED ORDER — PROMETHAZINE HCL 25 MG RE SUPP: 25.0000 mg | Freq: Four times a day (QID) | RECTAL | 0 refills | Status: AC | PRN

## 2023-10-11 NOTE — Discharge Instructions (Signed)
 Please follow-up with your primary care doctor for additional management.

## 2023-10-11 NOTE — ED Notes (Signed)
Patient provided water and saltines for PO challenge.

## 2023-10-11 NOTE — ED Provider Notes (Signed)
 [facility] Provider Note   CSN: [id number] Arrival date & time: 10/10/23  1647     Patient presents with: Emesis   [facility] is a 28 y.o. female.   HPI     28 year old female comes in with cc of emesis since this morning.  Patient states that she has had some abdominal pain for the last 3 days.  She has noted some blood in the stool.  This morning she started having vomiting.  She has had at least 10 episodes of emesis.  Now she is having bilious emesis.  She has noted speckles of blood in her emesis.  Patient has had abdominal pain, nausea, vomiting intermittently for the last several years.  She has seen PCP about this, but not particularly discussed about her symptoms recently.  She has not seen GI.  Patient has no family history of IBD.  Patient denies any UTI-like symptoms, vaginal discharge or bleeding.  No suspicion from her side about pelvic infection.  Prior to Admission medications   Medication Sig Start Date End Date Taking? Authorizing Provider  promethazine  (PHENERGAN ) 25 MG suppository Place 1 suppository (25 mg total) rectally every 6 (six) hours as needed for nausea or vomiting. 10/11/23  Yes Lilymae Swiech, MD  fluticasone  (FLONASE ) 50 MCG/ACT nasal spray Place 1 spray into both nostrils daily as needed. 02/17/23   Hermanns, Ashlee P, PA-C  gabapentin (NEURONTIN) 300 MG capsule Take 300 mg by mouth 3 (three) times daily. 10/15/22   [provider]  LORazepam (ATIVAN) 0.5 MG tablet Take 0.25-0.5 mg by mouth every 6 (six) hours as needed. 04/18/23   [provider]  ondansetron  (ZOFRAN -ODT) 4 MG disintegrating tablet Take 1 tablet (4 mg total) by mouth every 8 (eight) hours as needed for nausea or vomiting. 04/27/23   Mecum, Erin E, PA-C  promethazine -dextromethorphan (PROMETHAZINE -DM) 6.25-15 MG/5ML syrup Take 5 mLs by mouth 4 (four) times daily as needed for cough. 02/17/23   Hermanns, Ashlee P, PA-C     Allergies: Penicillins    Review of Systems  All other systems reviewed and are negative.   Updated Vital Signs BP 123/81   Pulse 73   Temp 97.8 F (36.6 C) (Temporal)   Resp 18   Ht 5' 7 (1.702 m)   Wt 74.8 kg   LMP 09/26/2023 (Approximate)   SpO2 97%   BMI 25.84 kg/m   Physical Exam Vitals and nursing note reviewed.  Constitutional:      Appearance: She is well-developed.  HENT:     Head: Atraumatic.  Cardiovascular:     Rate and Rhythm: Normal rate.  Pulmonary:     Effort: Pulmonary effort is normal.  Abdominal:     Tenderness: There is abdominal tenderness. There is no guarding or rebound.     Comments: Periumbilical tenderness  Musculoskeletal:     Cervical back: Normal range of motion and neck supple.  Skin:    General: Skin is warm and dry.  Neurological:     Mental Status: She is alert and oriented to person, place, and time.     (all labs ordered are listed, but only abnormal results are displayed) Labs Reviewed  COMPREHENSIVE METABOLIC PANEL WITH GFR - Abnormal; Notable for the following components:      Result Value   ALT 45 (*)    All other components within normal limits  URINALYSIS, ROUTINE W REFLEX MICROSCOPIC - Abnormal; Notable for the following components:  APPearance CLOUDY (*)    Hgb urine dipstick SMALL (*)    Ketones, ur 80 (*)    Protein, ur 30 (*)    Leukocytes,Ua SMALL (*)    Bacteria, UA RARE (*)    All other components within normal limits  LIPASE, BLOOD  CBC  PREGNANCY, URINE    EKG: None  Radiology: No results found.   Procedures   Medications Ordered in the ED  prochlorperazine  (COMPAZINE ) injection 10 mg (10 mg Intravenous Given 10/10/23 2251)  diphenhydrAMINE  (BENADRYL ) injection 25 mg (25 mg Intravenous Given 10/10/23 2251)  lactated ringers  bolus 1,000 mL (1,000 mLs Intravenous New Bag/Given 10/10/23 2251)  ketorolac  (TORADOL ) 15 MG/ML injection 15 mg (15 mg Intravenous Given 10/10/23 2251)                                     Medical Decision Making Amount and/or Complexity of Data Reviewed Labs: ordered.  Risk Prescription drug management.   28 year old female who is otherwise healthy and has no surgical history comes in with chief complaint of abdominal pain, nausea, vomiting, bloody stools and headaches.  Patient states that she will intermittently get migraine attacks and with that nausea and vomiting.  Her main complaint today is vomiting, she has had 15+ episodes of emesis.  However, she does not have any migraine headaches at this time.  She has noted some bloody stools over the past few days.  No diarrhea.  Diarrhea is not a bigger theme of her chronic complaint, but headaches, nausea, vomiting, abdominal discomfort has been.  Personal history, social history, family history reassuring.  No IBD history.  Has had C-section.  Plan at this time is to focus on symptom management.  I advised patient to discuss the case with PCP to see if she needs additional evaluation if the pain is persistent or recurring.  Differential diagnosis for this patient includes gastroenteritis, colitis, UTI, STI.  Basic labs including UA ordered.  Patient has indicated that she is not concerned for STI.   Final diagnoses:  Nausea and vomiting, unspecified vomiting type    ED Discharge Orders          Ordered    promethazine  (PHENERGAN ) 25 MG suppository  Every 6 hours PRN        10/11/23 0017               Charlyn Sora, MD 10/11/23 1004
